# Patient Record
Sex: Female | Born: 1975 | Hispanic: No | Marital: Married | Smoking: Never smoker
Health system: Southern US, Community
[De-identification: ages and names within clinical notes are randomized; demographics above are authoritative.]

---

## 1999-07-03 ENCOUNTER — Ambulatory Visit (HOSPITAL_COMMUNITY): Admission: RE | Admit: 1999-07-03 | Discharge: 1999-07-03 | Payer: Self-pay | Admitting: *Deleted

## 1999-12-06 ENCOUNTER — Inpatient Hospital Stay (HOSPITAL_COMMUNITY): Admission: AD | Admit: 1999-12-06 | Discharge: 1999-12-06 | Payer: Self-pay | Admitting: *Deleted

## 1999-12-07 ENCOUNTER — Inpatient Hospital Stay (HOSPITAL_COMMUNITY): Admission: RE | Admit: 1999-12-07 | Discharge: 1999-12-11 | Payer: Self-pay | Admitting: *Deleted

## 1999-12-07 ENCOUNTER — Encounter (INDEPENDENT_AMBULATORY_CARE_PROVIDER_SITE_OTHER): Payer: Self-pay

## 2002-04-01 ENCOUNTER — Ambulatory Visit (HOSPITAL_COMMUNITY): Admission: RE | Admit: 2002-04-01 | Discharge: 2002-04-01 | Payer: Self-pay | Admitting: *Deleted

## 2002-04-22 ENCOUNTER — Emergency Department (HOSPITAL_COMMUNITY): Admission: AC | Admit: 2002-04-22 | Discharge: 2002-04-22 | Payer: Self-pay

## 2002-08-27 ENCOUNTER — Ambulatory Visit (HOSPITAL_COMMUNITY): Admission: RE | Admit: 2002-08-27 | Discharge: 2002-08-27 | Payer: Self-pay | Admitting: *Deleted

## 2002-09-09 ENCOUNTER — Inpatient Hospital Stay (HOSPITAL_COMMUNITY): Admission: AD | Admit: 2002-09-09 | Discharge: 2002-09-13 | Payer: Self-pay | Admitting: *Deleted

## 2006-07-02 ENCOUNTER — Ambulatory Visit (HOSPITAL_COMMUNITY): Admission: RE | Admit: 2006-07-02 | Discharge: 2006-07-02 | Payer: Self-pay | Admitting: Obstetrics and Gynecology

## 2006-10-30 ENCOUNTER — Ambulatory Visit: Payer: Self-pay | Admitting: *Deleted

## 2006-10-30 ENCOUNTER — Inpatient Hospital Stay (HOSPITAL_COMMUNITY): Admission: RE | Admit: 2006-10-30 | Discharge: 2006-10-30 | Payer: Self-pay | Admitting: Obstetrics and Gynecology

## 2006-11-03 ENCOUNTER — Ambulatory Visit: Payer: Self-pay | Admitting: Gynecology

## 2006-11-06 ENCOUNTER — Ambulatory Visit: Payer: Self-pay | Admitting: Obstetrics & Gynecology

## 2006-11-10 ENCOUNTER — Ambulatory Visit: Payer: Self-pay | Admitting: Family Medicine

## 2006-11-13 ENCOUNTER — Inpatient Hospital Stay (HOSPITAL_COMMUNITY): Admission: RE | Admit: 2006-11-13 | Discharge: 2006-11-16 | Payer: Self-pay | Admitting: Gynecology

## 2006-11-13 ENCOUNTER — Ambulatory Visit: Payer: Self-pay | Admitting: Gynecology

## 2006-11-13 ENCOUNTER — Ambulatory Visit: Payer: Self-pay | Admitting: Obstetrics & Gynecology

## 2006-11-13 ENCOUNTER — Encounter (INDEPENDENT_AMBULATORY_CARE_PROVIDER_SITE_OTHER): Payer: Self-pay | Admitting: Gynecology

## 2008-04-05 IMAGING — US US OB FOLLOW-UP
2 series · 14 of 28 positions shown · non-contrast
Comparison: none

OBSTETRICAL ULTRASOUND:

 This ultrasound exam was performed in the [HOSPITAL] Ultrasound Department.  The OB US report was generated in the AS system, and faxed to the ordering physician.  This report is also available in [REDACTED] PACS.

[Series 1: us ob re-eval · 13 of 49 slices shown (1 of 2)]
[im 2/49]
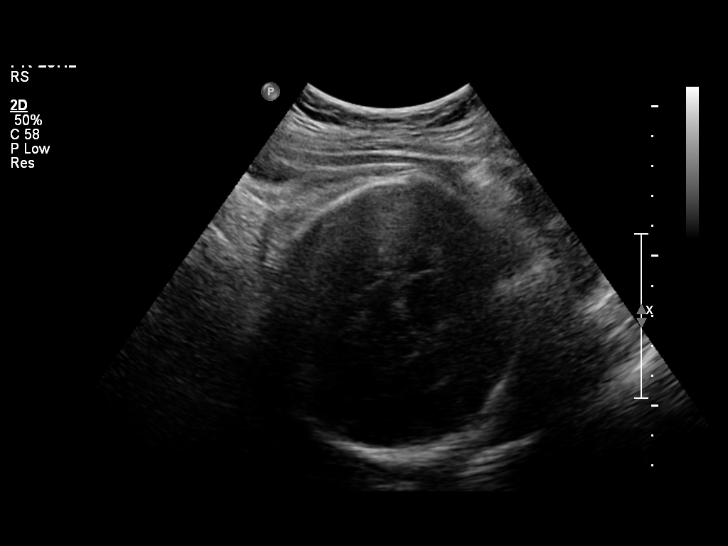
[im 6/49]
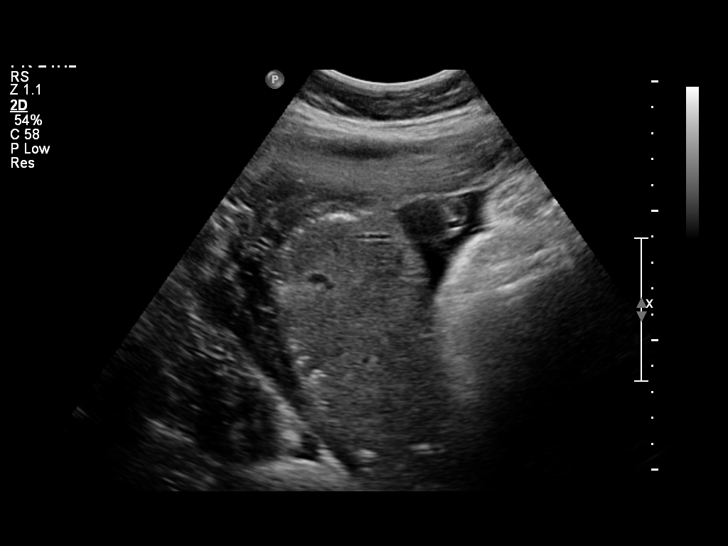
[im 10/49]
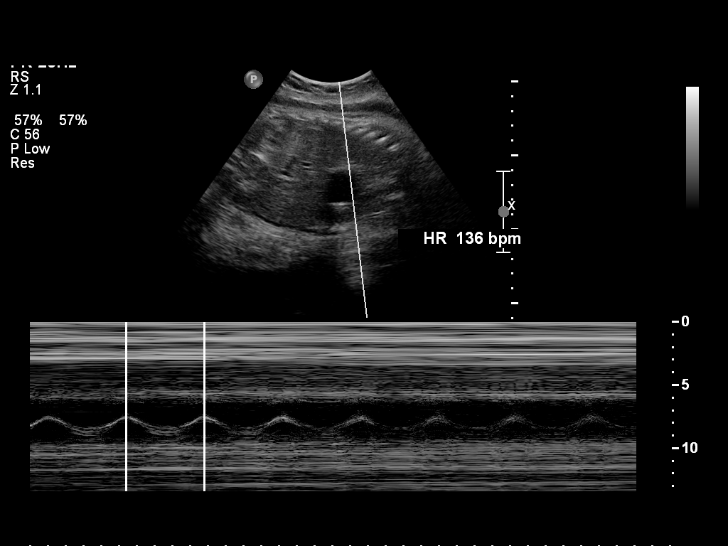
[im 14/49]
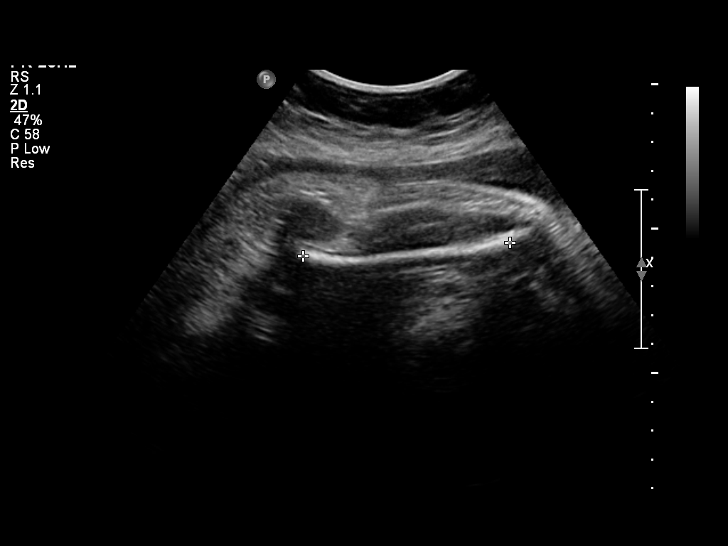
[im 18/49]
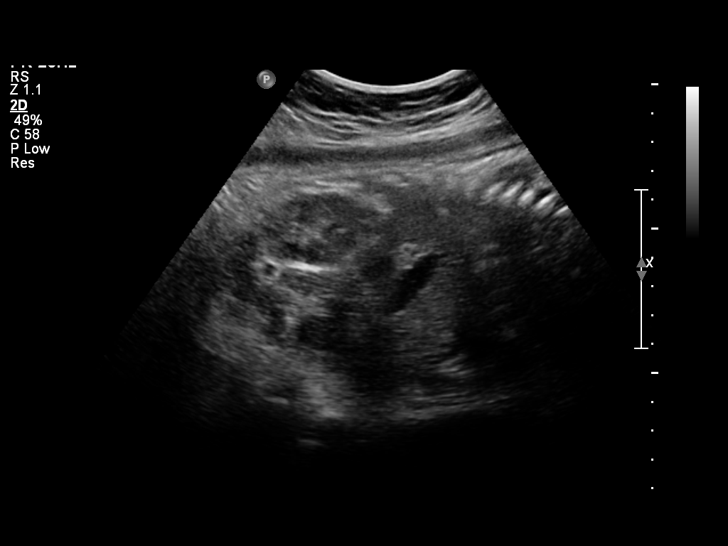
[im 22/49]
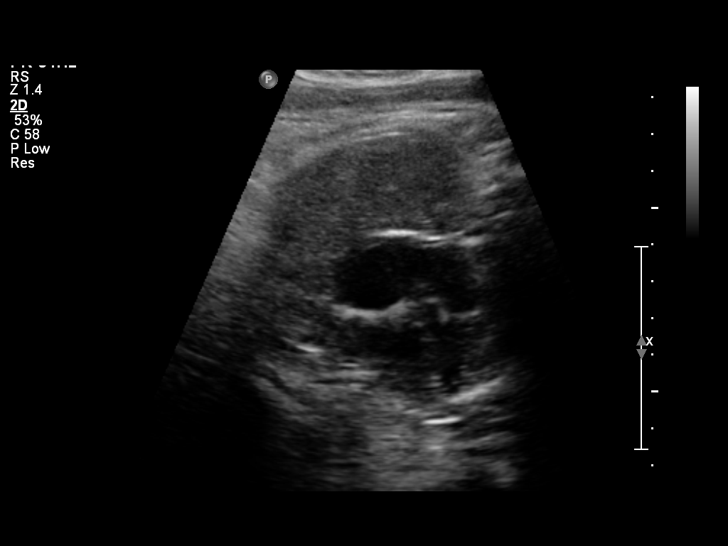
[im 25/49]
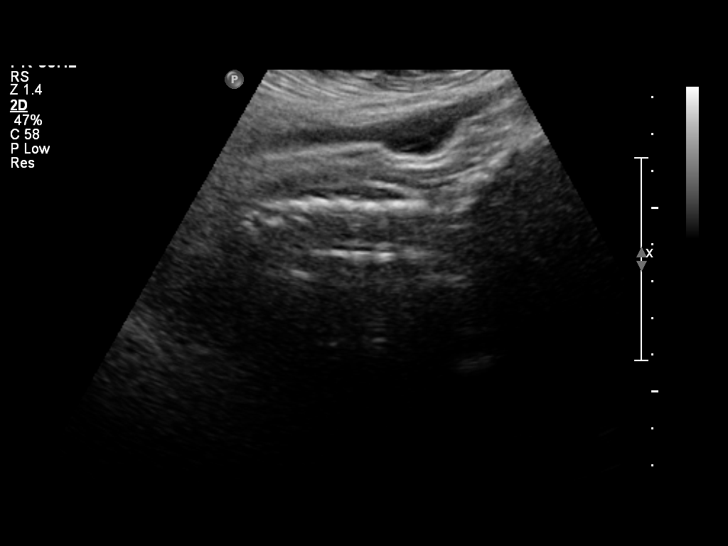
[im 29/49]
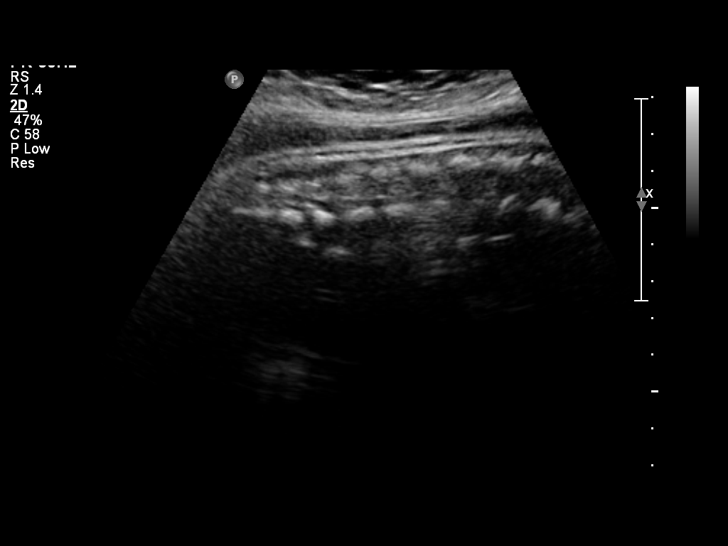
[im 33/49]
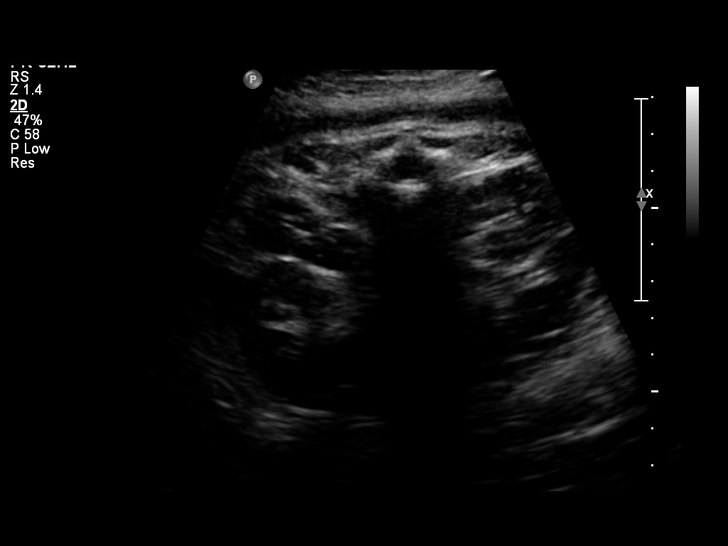
[im 37/49]
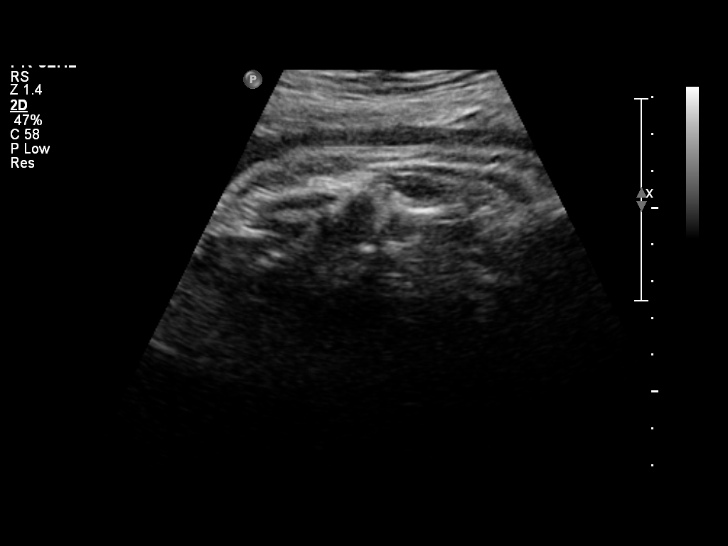
[im 41/49]
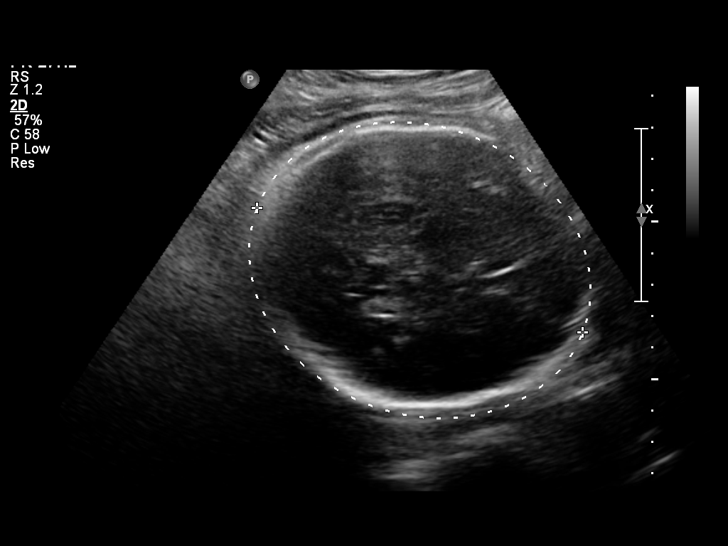
[im 45/49]
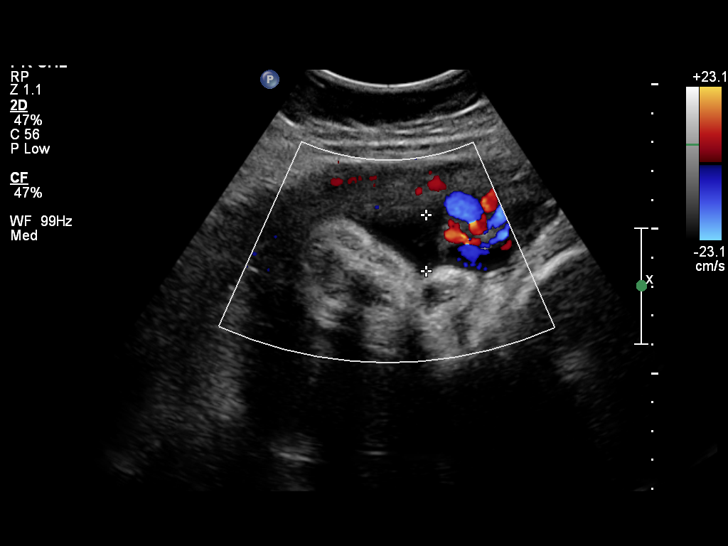
[im 49/49]
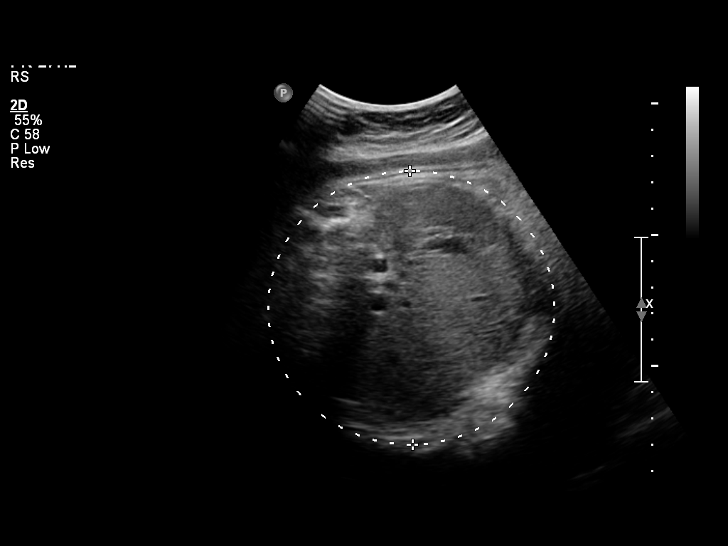

[Series 1: us ob re-eval · 1 of 4 slices shown (2 of 2)]
[im 4/4]
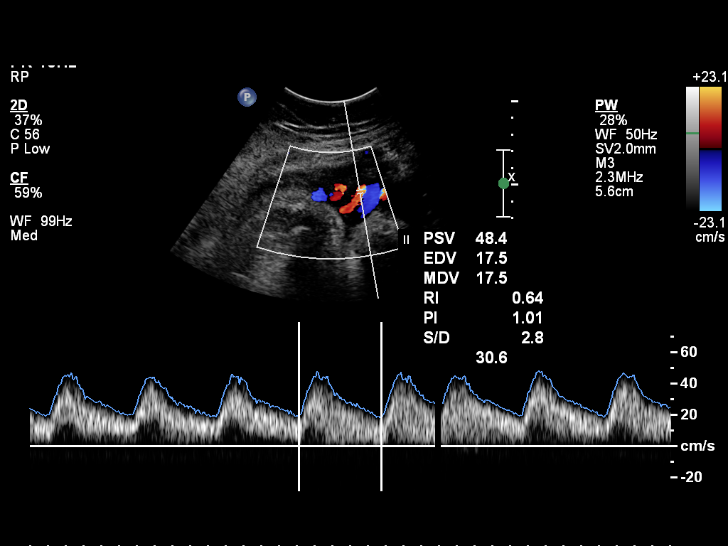

[14 of 28 positions shown; findings below may reference images not displayed]

IMPRESSION: See AS Obstetric US report.

## 2010-10-09 NOTE — Discharge Summary (Signed)
NAMEROSELL, KHOURI               ACCOUNT NO.:  [id number]   MEDICAL RECORD NO.:  [id number]          PATIENT TYPE:  INP   LOCATION:  9125                          FACILITY:  [facility]   PHYSICIAN:  Phil D. Okey Dupre, M.D.     DATE OF BIRTH:  1975-11-28   DATE OF ADMISSION:  11/13/2006  DATE OF DISCHARGE:  11/16/2006                               DISCHARGE SUMMARY   DISCHARGE DIAGNOSIS:  1. Term intrauterine pregnancy.  2. Repeat low transverse cesarean section/bilateral tubal ligation.  3. A2 gestational diabetes.  4. Acute blood loss anemia.   REASON FOR ADMISSION:  This is a 35 year old, G3, who had had previous  cesarean sections x2 in 2001 and 2004.  She was presenting as scheduled  for a  repeat elective C-section.  Of note, she had A2 gestational  diabetes, fair control and she had oligohydramnios.  There is good fetal  movement.  There is no rupture of membranes or bleeding.  She underwent  repeat LTCS/BTL and had a viable female infant. Postoperative course and  her surgery was uncomplicated.  The placenta was sent to pathology and  SEBL was 600 mL.  Weight was 8 pounds 15 ounces.  She had good pain  control postoperatively.  She was breast and bottle feeding without  problems and no orthostatic symptoms and tolerating regular diet.  Her  vital signs remained stable, stable hemoglobin was 10.4 on postoperative  day #3.  Had been 13.6.  Her abdomen was obese and minimally tender.  Fundus was firm and the incision was clean, dry, intact with staples  which were removed on postoperative day #3.  Dressing was applied.  There was good approximation.  She was discharged home to follow up with  myself in six weeks.   DISCHARGE MEDICATIONS:  1. Vitamin one a day.  2. Iron one twice a day.  3. Percocet 5/325 one to two q.4-6h, #30 with no refill.  4. Ibuprofen 600 one  p.o. q.6h.      Deirdre Christy Gentles, C.N.M.      Phil D. Okey Dupre, M.D.  Electronically Signed    DP/MEDQ  D:  12/31/2006   T:  01/01/2007  Job:  [id number]

## 2010-10-09 NOTE — Op Note (Signed)
Christy Peters, Christy Peters               ACCOUNT NO.:  [id number]   MEDICAL RECORD NO.:  [id number]          PATIENT TYPE:  INP   LOCATION:  9125                          [facility]ACILITY:  [facility]   PHYSICIAN:  Ginger Carne, MD  DATE OF BIRTH:  03/23/1976   DATE OF PROCEDURE:  11/13/2006  DATE OF DISCHARGE:                               OPERATIVE REPORT   PREOPERATIVE DIAGNOSIS:  Intrauterine pregnancy at term, history of  prior cesarean sections x2, gestational diabetes, oligohydramnios,  undesired fertility.   POSTOPERATIVE DIAGNOSIS:  Intrauterine pregnancy at term, history of  prior cesarean sections x2, gestational diabetes, oligohydramnios,  undesired fertility.   PROCEDURE:  Repeat low transverse cesarean section and bilateral tubal  ligation via Pomeroy method.   SURGEON:  Ginger Carne, M.D.   ASSISTANT:  Dr. Wilburt Finlay   ANESTHESIA:  Spinal.   SPECIMEN:  Placenta to labor and delivery.   ESTIMATED BLOOD LOSS:  600 mL.   COMPLICATIONS:  None.   FINDINGS:  Viable female infant, Apgars of eight and nine.  Birth weight 8  pounds 15 ounces.   PROCEDURE:  The patient was taken to the operating room where her spinal  anesthesia was found to be adequate.  She was then prepped and draped in  a normal sterile fashion in dorsal supine position with a leftward tilt.  The Pfannenstiel skin incision was then made with scalpel and carried  through to the underlying layer of fascia.  The fascia was incised in  the midline and the incision extended laterally with Mayo scissors.  The  superior aspect of the fascial incision was then grasped with Kocher  clamps, elevated and underlying rectus muscles dissected off bluntly.  Attention was then turned to the inferior aspect of the incision which  in a similar fashion was grasped, tented up with the Kocher clamps and  the rectus muscles dissected off bluntly.  The rectus muscles were then  separated in the midline and the peritoneum  identified, tented up and  entered sharply with the Metzenbaum scissors.  The peritoneal incision  was then extended superiorly and inferiorly with good visualization of  the bladder.  The bladder blade was inserted and the vesicouterine  peritoneum identified, grasped with pickups and entered sharply with  Metzenbaum scissors.  This incision was then extended laterally and the  bladder flap created digitally.  The bladder blade was then reinserted  and the lower uterine segment was noted to be very translucent and was  incised in a transverse fashion with a scalpel.  The uterine incision  was extended bluntly.  The bladder blade was removed and the infant's  head delivered atraumatically.  The nose and mouth were suctioned and  the cord clamped and cut.  The infant was handed off to the waiting  pediatricians.  Cord blood was obtained.  The placenta was then removed  spontaneously.  The uterus exteriorized and cleared of all clots and  debris.  The uterine incision was repaired with 0 Vicryl in a running  locked fashion.  Excellent hemostasis was noted and attention was turned  to  the tubal ligation. The patient's left fallopian tube was identified,  grasped with a Babcock clamp and the tube followed out to its individual  fimbria.  The Babcock clamp was then used to grasp the tube  approximately 4 cm from the cornual region. The tube was anchored with a  suture and tied with plain gut x2 and excised. The ends of the tubes  were electrocauterized with excellent hemostasis noted.  The tube was  returned to the abdomen and the patient's right fallopian tube was  identified, brought to the incision and grasped with Babcock clamp.  This tube was also followed out to its fimbria.  The Babcock clamp was  used to grasp the tube approximately 4 cm from the cornual region.  The  tube was anchored and a 3 cm segment of tube was tied with plain gut x2  and excised the ends of the tube were  electrocauterized with excellent  hemostasis noted.  The tube was returned to the abdomen.  The uterus was  returned to the to the abdomen.  The uterine incision was reinspected  again several times with excellent hemostasis noted.  The gutters were  cleared of all clots and the fascia was reapproximated with 0 Vicryl in  a running fashion. The skin was closed with staples. The patient  tolerated the procedure well.  Sponge, lap and needle counts were  correct x2.  2 grams of Ancef was given at cord clamp.  The patient was  taken to recovery room in stable condition.     ______________________________  Paticia Stack, MD      Ginger Carne, MD  Electronically Signed    LNJ/MEDQ  D:  11/13/2006  T:  11/13/2006  Job:  [id number]

## 2010-10-12 NOTE — Discharge Summary (Signed)
[facility]  Patient:    Christy Peters, Christy Peters                       MRN: [id number] Adm. Date:  12/07/99 Disc. Date: 12/11/99 Attending:  Conni Elliot, M.D. Dictator:   Jacobo Forest, M.D.                           Discharge Summary  DISCHARGE DIAGNOSES:          1. Status post lower transverse cesarean section                                  for repetitive fetal heart rate                                  decelerations.                               2. Anemia.  DISCHARGE MEDICATIONS:        Ibuprofen, Micronor, iron.  FOLLOW-UP:                    The patient is to follow up at [facility] in six weeks.  HISTORY AND PHYSICAL:         In brief, [name] is a 35 year old, G1, P0, Hispanic female, Spanish speaker only, at 40-5/7 weeks, who was sent to [facility] after a routine postpartum testing showing BPP 6/8 and AFI 1.2.  Prenatal history significant for GBS negative.  The patient was admitted post dates with severe oligohydramnios.  The fetal heart rate tracing was within normal limits.  HOSPITAL COURSE:              [name] was admitted post dates with severe oligohydramnios.  Soon after admission, she was seen to have repetitive fetal heart rate decelerations and taken for lower transverse cesarean section, which was performed by Conni Elliot, M.D., and Jacobo Forest, M.D.  No complications.  The patient had a viable female with Apgars of 8 at one minute and 9 at five minutes.  Her postpartum course was unremarkable.  DISPOSITION:                  The patient was discharged home in good condition on December 11, 1999, with staples removed. DD:  02/29/00 TD:  03/01/00 Job: [id number]

## 2010-10-12 NOTE — Discharge Summary (Signed)
   [name], Christy Peters                           ACCOUNT NO.:  [id number]   MEDICAL RECORD NO.:  [id number]                   PATIENT TYPE:  INP   LOCATION:  9136                                 FACILITY:  [facility]   PHYSICIAN:  Tanya S. Shawnie Pons, M.D.                DATE OF BIRTH:  10-29-1975   DATE OF ADMISSION:  09/09/2002  DATE OF DISCHARGE:  09/13/2002                                 DISCHARGE SUMMARY   FINAL DIAGNOSES:  1. Intrauterine pregnancy at 52 and 6/ths weeks.  2. Severe oligohydramnios.  3. Previous cesarean section.  4. Gestational diabetes mellitus, A1.   REASON FOR ADMISSION:  Briefly, the patient is a 35 year old, G2, P55, at 8  and 6/7ths week with severe oligohydramnios, and a previous cesarean  section, who had gestational diabetes during this pregnancy, and elected for  a repeat cesarean section.   HOSPITAL COURSE:  The patient was admitted on the night prior to her  cesarean section for continuous fetal monitoring.  She then underwent an  elective repeat cesarean section the next day by Dr. Gavin Potters.  It was a low  transverse, and there were no complications.  The patient postoperatively  did well and was discharged home on postoperative day #3.   DISCHARGE INSTRUCTIONS:  1. Pelvic rest for the next six weeks.  2. No heavy lifting for the next six weeks.  3. Follow up in two weeks for an incision check.  4. Postpartum check in six weeks.  She will need blood sugar testing at her     postpartum visit.   She will breast and bottle feed, and she is on Micronor for contraception.                                               [name]. Shawnie Pons, M.D.    TSP/MEDQ  D:  01/12/2003  T:  01/12/2003  Job:  [id number]

## 2010-10-12 NOTE — Op Note (Signed)
[facility]  Patient:    Christy Peters, Christy Peters                        MRN: [id number] Proc. Date: 12/08/99 Adm. Date:  [id number] Attending:  Michaelle Copas                           Operative Report  PREOPERATIVE DIAGNOSIS:       Repetitive fetal heart rate decellerations with fetal tachycardia and oligohydramnios.  POSTOPERATIVE DIAGNOSIS:      Repetitive fetal heart rate decellerations with fetal tachycardia and oligohydramnios.  OPERATION:                    Low transverse cesarean section.  SURGEON:                      Conni Elliot, M.D.  ASSISTANT:                    Jacobo Forest, M.D.  ANESTHESIA:                   Continuous lumbar epidural.  ESTIMATED BLOOD LOSS:  FINDINGS:                     A 5 pound 15 ounce female with Apgars of 8 and 9.  Cord pH is 7.34.  Placenta was sent to pathology.  DESCRIPTION OF PROCEDURE:     After bringing the continuous lumbar epidural anesthetic to an operative level with the patient in supine left lateral tilt position receiving oxygen, the abdomen was prepped and draped in the usual sterile fashion.  A low transverse Pfannenstiel incision was made.  Incision was made through the skin, subcutaneous tissue, and fascia.  The rectus muscles were separated in the midline.  The peritoneal cavity was entered.  The bladder flap was created.  A low transverse uterine incision was made.  The babys head was delivered and DeLee suctioned.  The remainder of the baby was delivered.  Cord doubly clamped and cut and the baby handed to the neonatologist in attendance. The placenta was delivered spontaneously.  There was meconium noted along the placenta and rectum of the child.  The uterus, bladder flap, anterior peritoneum, fascia, subcutaneous tissue, and skin were closed in a routine fashion.  Estimated blood loss was less than 1000 cc.  Sponge, needle, and instrument counts were correct. DD:   12/08/99 TD:  12/08/99 Job: [id number]

## 2011-03-13 LAB — CBC
HCT: 40.8
Hemoglobin: 10.8 — ABNORMAL LOW
MCHC: 33.3
MCV: 83.4
MCV: 84.8
Platelets: 146 — ABNORMAL LOW
RBC: 3.82 — ABNORMAL LOW
RDW: 16.4 — ABNORMAL HIGH
RDW: 16.6 — ABNORMAL HIGH
WBC: 6.9

## 2011-03-13 LAB — POCT URINALYSIS DIP (DEVICE)
Ketones, ur: NEGATIVE
Operator id: 120861
Protein, ur: NEGATIVE
Urobilinogen, UA: 0.2
pH: 7

## 2011-03-13 LAB — RPR: RPR Ser Ql: NONREACTIVE

## 2011-03-13 LAB — TYPE AND SCREEN: Antibody Screen: NEGATIVE

## 2011-03-14 LAB — POCT URINALYSIS DIP (DEVICE)
Bilirubin Urine: NEGATIVE
Glucose, UA: NEGATIVE
Hgb urine dipstick: NEGATIVE
Ketones, ur: NEGATIVE
Operator id: 134861
Specific Gravity, Urine: 1.015

## 2017-05-26 ENCOUNTER — Ambulatory Visit: Payer: Self-pay | Admitting: Family Medicine

## 2017-05-26 ENCOUNTER — Other Ambulatory Visit: Payer: Self-pay

## 2017-05-26 ENCOUNTER — Encounter: Payer: Self-pay | Admitting: Family Medicine

## 2017-05-26 VITALS — BP 122/82 | HR 130 | Temp 99.7°F | Resp 16 | Ht 59.45 in | Wt 169.0 lb

## 2017-05-26 DIAGNOSIS — J101 Influenza due to other identified influenza virus with other respiratory manifestations: Secondary | ICD-10-CM

## 2017-05-26 DIAGNOSIS — R509 Fever, unspecified: Secondary | ICD-10-CM

## 2017-05-26 DIAGNOSIS — R300 Dysuria: Secondary | ICD-10-CM

## 2017-05-26 LAB — POCT URINALYSIS DIP (MANUAL ENTRY)
BILIRUBIN UA: NEGATIVE
Glucose, UA: NEGATIVE mg/dL
Nitrite, UA: NEGATIVE
Protein Ur, POC: 100 mg/dL — AB
Spec Grav, UA: 1.02 (ref 1.010–1.025)
Urobilinogen, UA: 4 E.U./dL — AB
pH, UA: 6 (ref 5.0–8.0)

## 2017-05-26 LAB — POCT INFLUENZA A/B
Influenza A, POC: POSITIVE — AB
Influenza B, POC: NEGATIVE

## 2017-05-26 MED ORDER — CEPHALEXIN 500 MG PO CAPS: 500.0000 mg | ORAL_CAPSULE | Freq: Three times a day (TID) | ORAL | 0 refills | Status: DC

## 2017-05-26 MED ORDER — OSELTAMIVIR PHOSPHATE 75 MG PO CAPS: 75.0000 mg | ORAL_CAPSULE | Freq: Two times a day (BID) | ORAL | 0 refills | Status: DC

## 2017-05-26 NOTE — Progress Notes (Signed)
Subjective:    Patient ID: Christy Peters, female    DOB: 20-Jan-1976, 41 y.o.   MRN: [id number]  05/26/2017  Fever (with bodyachs and chills x 3 days )    HPI This 41 y.o. female presents for 3-4 days of feve, chills/sweats.  Mild headache.  Has not checked temperature. No ear pain.  No sore throat.  No rhinorrhea.  No body aches.  No cough.  No n/v/d.  No abdominal pain.    dysuria for one day with onset of fever.  Yellow urine.  No hematuria.  No urinary frequency. Nocturia x 0. No sick contacts. No working.  Tylenol 2:00pm. No SOB.     BP Readings from Last 3 Encounters:  05/26/17 122/82   Wt Readings from Last 3 Encounters:  05/26/17 169 lb (76.7 kg)    There is no immunization history on file for this patient.  Review of Systems  Constitutional: Positive for fatigue and fever. Negative for chills and diaphoresis.  HENT: Negative for congestion, ear pain, postnasal drip, rhinorrhea and sore throat.   Eyes: Negative for visual disturbance.  Respiratory: Negative for cough and shortness of breath.   Cardiovascular: Negative for chest pain, palpitations and leg swelling.  Gastrointestinal: Negative for abdominal pain, anal bleeding, blood in stool, constipation, diarrhea, nausea and vomiting.  Endocrine: Negative for cold intolerance, heat intolerance, polydipsia, polyphagia and polyuria.  Genitourinary: Positive for dysuria.  Musculoskeletal: Negative for arthralgias.  Skin: Negative for rash.  Neurological: Negative for dizziness, tremors, seizures, syncope, facial asymmetry, speech difficulty, weakness, light-headedness, numbness and headaches.    History reviewed. No pertinent past medical history. History reviewed. No pertinent surgical history. No Known Allergies No current outpatient medications on file prior to visit.   No current facility-administered medications on file prior to visit.    Social History   Socioeconomic History  . Marital status: Married   Spouse name: Not on file  . Number of children: Not on file  . Years of education: Not on file  . Highest education level: Not on file  Social Needs  . Financial resource strain: Not on file  . Food insecurity - worry: Not on file  . Food insecurity - inability: Not on file  . Transportation needs - medical: Not on file  . Transportation needs - non-medical: Not on file  Occupational History  . Not on file  Tobacco Use  . Smoking status: Never Smoker  . Smokeless tobacco: Never Used  Substance and Sexual Activity  . Alcohol use: Not on file  . Drug use: Not on file  . Sexual activity: Not on file  Other Topics Concern  . Not on file  Social History Narrative  . Not on file   History reviewed. No pertinent family history.     Objective:    BP 122/82   Pulse (!) 130   Temp 99.7 F (37.6 C) (Oral)   Resp 16   Ht 4' 11.45" (1.51 m)   Wt 169 lb (76.7 kg)   LMP  (LMP Unknown)   SpO2 95%   BMI 33.62 kg/m  Physical Exam  Constitutional: She is oriented to person, place, and time. She appears well-developed and well-nourished. No distress.  HENT:  Head: Normocephalic and atraumatic.  Right Ear: External ear normal.  Left Ear: External ear normal.  Nose: Nose normal.  Mouth/Throat: Oropharynx is clear and moist.  Eyes: Conjunctivae and EOM are normal. Pupils are equal, round, and reactive to light.  Neck: Normal  range of motion. Neck supple. Carotid bruit is not present. No thyromegaly present.  Cardiovascular: Normal rate, regular rhythm, normal heart sounds and intact distal pulses. Exam reveals no gallop and no friction rub.  No murmur heard. Pulmonary/Chest: Effort normal and breath sounds normal. She has no wheezes. She has no rales.  Abdominal: Soft. Bowel sounds are normal. She exhibits no distension and no mass. There is no tenderness. There is no rebound and no guarding.  Lymphadenopathy:    She has no cervical adenopathy.  Neurological: She is alert and  oriented to person, place, and time. No cranial nerve deficit.  Skin: Skin is warm and dry. No rash noted. She is not diaphoretic. No erythema. No pallor.  Psychiatric: She has a normal mood and affect. Her behavior is normal.   No results found. Depression screen PHQ 2/9 05/26/2017  Decreased Interest 0  Down, Depressed, Hopeless 0  PHQ - 2 Score 0   Fall Risk  05/26/2017  Falls in the past year? No     Results for orders placed or performed in visit on 05/26/17  POCT Influenza A/B  Result Value Ref Range   Influenza A, POC Positive (A) Negative   Influenza B, POC Negative Negative  POCT urinalysis dipstick  Result Value Ref Range   Color, UA yellow yellow   Clarity, UA cloudy (A) clear   Glucose, UA negative negative mg/dL   Bilirubin, UA negative negative   Ketones, POC UA trace (5) (A) negative mg/dL   Spec Grav, UA 5.7841.020 6.9621.010 - 1.025   Blood, UA moderate (A) negative   pH, UA 6.0 5.0 - 8.0   Protein Ur, POC =100 (A) negative mg/dL   Urobilinogen, UA 4.0 (A) 0.2 or 1.0 E.U./dL   Nitrite, UA Negative Negative   Leukocytes, UA Small (1+) (A) Negative       Assessment & Plan:   1. Fever, unspecified fever cause   2. Influenza A   3. Dysuria     New onset influenza type A.  Prescription for Tamiflu provided.  Recommend supportive care with Tylenol alternating with ibuprofen every 4 hours.  Also recommend Mucinex DM 1 tablet twice daily if cough develops.  Minimal symptoms at this time which is quite atypical.  Patient may develop sore throat, rhinorrhea, cough.  Return to clinic for acute decline.  New onset dysuria with associated fever in a patient did test positive for influenza A.  Due to abnormal urine and recent dysuria, empirically treat with Keflex therapy.  Return to clinic for flank pain, vomiting.  Urine culture not sent as patient is self-pay.  Patient should return to clinic if symptoms do not improve with Keflex.   Orders Placed This Encounter    Procedures  . POCT Influenza A/B  . POCT urinalysis dipstick   Meds ordered this encounter  Medications  . oseltamivir (TAMIFLU) 75 MG capsule    Sig: Take 1 capsule (75 mg total) by mouth 2 (two) times daily.    Dispense:  10 capsule    Refill:  0  . cephALEXin (KEFLEX) 500 MG capsule    Sig: Take 1 capsule (500 mg total) by mouth 3 (three) times daily.    Dispense:  21 capsule    Refill:  0    No Follow-up on file.   Danitza Schoenfeldt Paulita FujitaMartin Kirby Cortese, M.D. [facility]  [place] [facility] [address] [place], [place]  [id number] [phone] phone [phone] fax

## 2017-05-26 NOTE — Patient Instructions (Addendum)
Take TYLENOL TWO TABLETS. FOUR HOURS LATER, TAKE IBUPROFEN TWO TABLETS. THEN FOUR HOURS LATER, TYLENOL TWO TABLETS; THEN FOUR HOURS LATER, IBUPROFEN TWO TABLETS.   IF you received an x-ray today, you will receive an invoice from [facility]. Please contact [facility] at [phone] with questions or concerns regarding your invoice.   IF you received labwork today, you will receive an invoice from [facility]. Please contact [facility] at [phone] with questions or concerns regarding your invoice.   Our billing staff will not be able to assist you with questions regarding bills from these companies.  You will be contacted with the lab results as soon as they are available. The fastest way to get your results is to activate your My Chart account. Instructions are located on the last page of this paperwork. If you have not heard from us regarding the results in 2 weeks, please contact this office.     \ Gripe en los adultos (Influenza, Adult) La gripe es una infeccin en los pulmones, la nariz y la garganta (vas respiratorias). La causa un virus. La gripe provoca muchos sntomas del resfro comn, as como fiebre alta y Tourist information centre managerdolor corporal. Puede hacer que se sienta muy mal. Se transmite fcilmente de persona a persona (es contagiosa). La mejor manera de prevenir la gripe es aplicndose la vacuna contra la gripe todos los aos. CUIDADOS EN EL HOGAR  Tome los medicamentos de venta libre y los recetados solamente como se lo haya indicado el mdico.  Use un humidificador de aire fro para que el aire de su casa est ms hmedo. Esto puede facilitar la respiracin.  Descanse todo lo que sea necesario.  Beba suficiente lquido para mantener el pis (orina) claro o de color amarillo plido.  Al toser o estornudar, cbrase la boca y la [place].  Lvese las manos con agua y jabn frecuentemente, en especial despus de toser o Engineering geologistestornudar. Use un desinfectante para manos si no  dispone de [place] y [place]jabn.  Lanny HurstQudese en su casa y no concurra al Aleen Campitrabajo o a la escuela, como se lo haya indicado el mdico. A menos que deba ir al American Expressmdico, evite salir de su casa hasta que no tenga fiebre durante 24horas sin el uso de medicamentos.  Concurra a todas las visitas de control como se lo haya indicado el mdico. Esto es importante.  PREVENCIN  Aplicarse la vacuna anual contra la gripe es la mejor manera de evitar contagiarse la gripe. Puede aplicarse la vacuna contra la gripe a fines de verano, en otoo o en invierno. Pregntele al mdico cundo debe aplicarse la vacuna contra la gripe.  Lvese las manos o use un desinfectante de manos con frecuencia.  Evite el contacto con personas que estn enfermas durante la temporada de resfro y gripe.  Consuma alimentos saludables.  Beba abundantes lquidos.  Duerma lo suficiente.  Haga ejercicios regularmente.  SOLICITE AYUDA SI:  Tiene sntomas nuevos.  Tiene los siguientes sntomas: ? Journalist, newspaperDolor en el pecho. ? Deposiciones lquidas (diarrea). ? Fiebre.  La tos empeora.  Empieza a tener ms mucosidad.  Siente malestar estomacal (nuseas).  Vomita.  SOLICITE AYUDA DE INMEDIATO SI:  Siente que le falta el aire o tiene dificultad para Industrial/product designerrespirar.  La piel o las uas se tornan de un color Brooklynazulado.  Presenta un dolor intenso o rigidez en el cuello.  Siente dolor de cabeza de forma repentina.  Le duele la cara o el odo de forma repentina.  No puede detener los vmitos.  Esta informacin no tiene Theme park managercomo fin reemplazar el consejo del mdico. Asegrese de hacerle al mdico cualquier pregunta que tenga. Document Released: 08/09/2008 Document Revised: 09/04/2015 Document Reviewed: 03/07/2015 Elsevier Interactive Patient Education  2017 [employer]

## 2017-10-16 ENCOUNTER — Encounter: Payer: Self-pay | Admitting: Family Medicine

## 2022-07-24 ENCOUNTER — Encounter (HOSPITAL_COMMUNITY): Payer: Self-pay | Admitting: *Deleted

## 2022-07-24 ENCOUNTER — Other Ambulatory Visit: Payer: Self-pay

## 2022-07-24 ENCOUNTER — Ambulatory Visit (HOSPITAL_COMMUNITY)
Admission: EM | Admit: 2022-07-24 | Discharge: 2022-07-24 | Disposition: A | Discharge status: Home / Self Care | Payer: Self-pay | Attending: Physician Assistant | Admitting: Physician Assistant

## 2022-07-24 DIAGNOSIS — R1013 Epigastric pain: Secondary | ICD-10-CM | POA: Insufficient documentation

## 2022-07-24 LAB — CBC WITH DIFFERENTIAL/PLATELET
Abs Immature Granulocytes: 0.01 10*3/uL (ref 0.00–0.07)
Basophils Absolute: 0.1 10*3/uL (ref 0.0–0.1)
Basophils Relative: 1 %
Eosinophils Absolute: 0.1 10*3/uL (ref 0.0–0.5)
Eosinophils Relative: 1 %
HCT: 39.8 % (ref 36.0–46.0)
Hemoglobin: 12.9 g/dL (ref 12.0–15.0)
Immature Granulocytes: 0 %
Lymphocytes Relative: 45 %
Lymphs Abs: 2.9 10*3/uL (ref 0.7–4.0)
MCH: 27.9 pg (ref 26.0–34.0)
MCHC: 32.4 g/dL (ref 30.0–36.0)
MCV: 86.1 fL (ref 80.0–100.0)
Monocytes Absolute: 0.5 10*3/uL (ref 0.1–1.0)
Monocytes Relative: 7 %
Neutro Abs: 3 10*3/uL (ref 1.7–7.7)
Neutrophils Relative %: 46 %
Platelets: 275 10*3/uL (ref 150–400)
RBC: 4.62 MIL/uL (ref 3.87–5.11)
RDW: 13.7 % (ref 11.5–15.5)
WBC: 6.4 10*3/uL (ref 4.0–10.5)
nRBC: 0 % (ref 0.0–0.2)

## 2022-07-24 LAB — COMPREHENSIVE METABOLIC PANEL
ALT: 17 U/L (ref 0–44)
AST: 18 U/L (ref 15–41)
Albumin: 3.7 g/dL (ref 3.5–5.0)
Alkaline Phosphatase: 60 U/L (ref 38–126)
Anion gap: 12 (ref 5–15)
BUN: 11 mg/dL (ref 6–20)
CO2: 23 mmol/L (ref 22–32)
Calcium: 9.5 mg/dL (ref 8.9–10.3)
Chloride: 107 mmol/L (ref 98–111)
Creatinine, Ser: 1.02 mg/dL — ABNORMAL HIGH (ref 0.44–1.00)
GFR, Estimated: >60 mL/min (ref >60)
Glucose, Bld: 93 mg/dL (ref 70–99)
Potassium: 3.6 mmol/L (ref 3.5–5.1)
Sodium: 142 mmol/L (ref 135–145)
Total Bilirubin: 0.5 mg/dL (ref 0.3–1.2)
Total Protein: 7.6 g/dL (ref 6.5–8.1)

## 2022-07-24 LAB — LIPASE, BLOOD: Lipase: 30 U/L (ref 11–51)

## 2022-07-24 MED ORDER — SUCRALFATE 1 G PO TABS: 1.0000 g | ORAL_TABLET | Freq: Three times a day (TID) | ORAL | 0 refills | Status: AC

## 2022-07-24 MED ORDER — PANTOPRAZOLE SODIUM 40 MG PO TBEC: 40.0000 mg | DELAYED_RELEASE_TABLET | Freq: Every day | ORAL | 0 refills | Status: DC

## 2022-07-24 NOTE — Discharge Instructions (Addendum)
Comience Carafate antes de cada comida y antes de la cena. Tome Protonix con Product manager vaco (30 minutos antes de las comidas o 2 horas despus). Consuma una dieta blanda y [employer] alimentos picantes, cidos o grasos. No tome AINE, incluidos aspirina, ibuprofeno/Advil, naproxeno/Aleve. Evite el alcohol. Quiero que hagas un seguimiento con un especialista gastrointestinal; llmelos para programar una cita. Si tiene dolor intenso, nuseas, vmitos, sangre en el vmito o [employer], debe acudir a la sala de Multimedia programmer.

## 2022-07-24 NOTE — ED Provider Notes (Signed)
[facility]    CSN: [id number] Arrival date & time: 07/24/22  1152      History   Chief Complaint Chief Complaint  Patient presents with   Abdominal Pain    HPI Christy Peters is a 47 y.o. female.   Patient presents today with a several month history of intermittent epigastric abdominal pain.  She is Spanish-speaking and video interpreter was utilized during visit.  She reports that during episodes pain is rated 8 on a 0-10 pain scale, described as a burning, localized to epigastrium with radiation towards her back, no aggravating or alleviating factors identified.  She has tried Tylenol and ibuprofen without improvement.  Denies history of gastrointestinal disorder including previous episodes of pancreatitis.  She is currently asymptomatic but reports that symptoms have become more frequent prompting evaluation.  She has not identified any trigger including dietary changes.  Denies any recent medication change, increased alcohol consumption, GLP-1 agonist use.  She denies additional symptoms including fever, nausea, vomiting, melena, hematochezia.  She has not seen a GI specialist in the past.    History reviewed. No pertinent past medical history.  There are no problems to display for this patient.   Past Surgical History:  Procedure Laterality Date   CESAREAN SECTION      OB History   No obstetric history on file.      Home Medications    Prior to Admission medications   Medication Sig Start Date End Date Taking? Authorizing Provider  pantoprazole (PROTONIX) 40 MG tablet Take 1 tablet (40 mg total) by mouth daily. 07/24/22  Yes Oumar Marcott K, PA-C  sucralfate (CARAFATE) 1 g tablet Take 1 tablet (1 g total) by mouth 4 (four) times daily -  with meals and at bedtime. 07/24/22  Yes Jaquarious Grey, Derry Skill, PA-C    Family History History reviewed. No pertinent family history.  Social History Social History   Tobacco Use   Smoking status: Never   Smokeless  tobacco: Never  Vaping Use   Vaping Use: Never used  Substance Use Topics   Alcohol use: Yes    Comment: occasionally     Allergies   Patient has no known allergies.   Review of Systems Review of Systems  Constitutional:  Positive for activity change. Negative for appetite change, fatigue and fever.  Respiratory:  Negative for cough and shortness of breath.   Cardiovascular:  Negative for chest pain.  Gastrointestinal:  Positive for abdominal pain. Negative for diarrhea, nausea and vomiting.  Neurological:  Negative for dizziness, light-headedness and headaches.     Physical Exam Triage Vital Signs ED Triage Vitals  Enc Vitals Group     BP 07/24/22 1331 (!) 153/87     Pulse Rate 07/24/22 1331 80     Resp 07/24/22 1331 16     Temp 07/24/22 1331 98.1 F (36.7 C)     Temp Source 07/24/22 1331 Oral     SpO2 07/24/22 1331 99 %     Weight --      Height --      Head Circumference --      Peak Flow --      Pain Score 07/24/22 1334 0     Pain Loc --      Pain Edu? --      Excl. in [place]? --    No data found.  Updated Vital Signs BP (!) 153/87   Pulse 80   Temp 98.1 F (36.7 C) (Oral)   Resp  16   LMP 07/17/2022 (Approximate)   SpO2 99%   Visual Acuity Right Eye Distance:   Left Eye Distance:   Bilateral Distance:    Right Eye Near:   Left Eye Near:    Bilateral Near:     Physical Exam Vitals reviewed.  Constitutional:      General: She is awake. She is not in acute distress.    Appearance: Normal appearance. She is well-developed. She is not ill-appearing.     Comments: Very pleasant female appears stated age in no acute distress sitting comfortably in exam room  HENT:     Head: Normocephalic and atraumatic.     Mouth/Throat:     Pharynx: Uvula midline. No oropharyngeal exudate or posterior oropharyngeal erythema.  Cardiovascular:     Rate and Rhythm: Normal rate and regular rhythm.     Heart sounds: Normal heart sounds, S1 normal and S2 normal. No  murmur heard. Pulmonary:     Effort: Pulmonary effort is normal.     Breath sounds: Normal breath sounds. No wheezing, rhonchi or rales.     Comments: Clear to auscultation bilaterally Abdominal:     General: Bowel sounds are normal.     Palpations: Abdomen is soft.     Tenderness: There is no abdominal tenderness. There is no right CVA tenderness, left CVA tenderness, guarding or rebound.     Comments: Benign abdominal exam  Psychiatric:        Behavior: Behavior is cooperative.      UC Treatments / Results  Labs (all labs ordered are listed, but only abnormal results are displayed) Labs Reviewed  CBC WITH DIFFERENTIAL/PLATELET  COMPREHENSIVE METABOLIC PANEL  LIPASE, BLOOD    EKG   Radiology No results found.  Procedures Procedures (including critical care time)  Medications Ordered in UC Medications - No data to display  Initial Impression / Assessment and Plan / UC Course  I have reviewed the triage vital signs and the nursing notes.  Pertinent labs & imaging results that were available during my care of the patient were reviewed by me and considered in my medical decision making (see chart for details).     Patient is well-appearing, afebrile, nontoxic, nontachycardic.  EKG was obtained which showed normal sinus rhythm with ventricular rate of 81 bpm no ischemic changes; no previous to compare.  She is currently asymptomatic and had a benign abdominal exam send no indication for emergent evaluation or imaging.  Differential includes pancreatitis versus GERD versus peptic ulcer disease.  CBC, CMP, lipase obtained today and are pending.  We will contact her if any of this lab work is abnormal.  Recommended she start Carafate as well as Protonix to manage her symptoms.  She is to avoid spicy/acidic/fatty foods, alcohol consumption, NSAIDs.  Recommended that she follow-up with GI for further evaluation and management and was given contact information for local provider  with instruction to call to schedule an appointment.  We discussed that we do not have imaging capabilities in urgent care and if she has recurrent severe symptoms she should be reevaluated immediately.  Strict return precautions given.  Work excuse note provided.  Final Clinical Impressions(s) / UC Diagnoses   Final diagnoses:  Abdominal pain, epigastric     Discharge Instructions      Comience Carafate antes de cada comida y antes de la cena. Tome Protonix con Product manager vaco (30 minutos antes de las comidas o 2 horas despus). Consuma una dieta blanda y [employer]  alimentos picantes, cidos o grasos. No tome AINE, incluidos aspirina, ibuprofeno/Advil, naproxeno/Aleve. Evite el alcohol. Quiero que hagas un seguimiento con un especialista gastrointestinal; llmelos para programar una cita. Si tiene dolor intenso, nuseas, vmitos, sangre en el vmito o [employer], debe acudir a la sala de Multimedia programmer.     ED Prescriptions     Medication Sig Dispense Auth. Provider   sucralfate (CARAFATE) 1 g tablet Take 1 tablet (1 g total) by mouth 4 (four) times daily -  with meals and at bedtime. 28 tablet Dorian Duval K, PA-C   pantoprazole (PROTONIX) 40 MG tablet Take 1 tablet (40 mg total) by mouth daily. 14 tablet Cataleyah Colborn, Derry Skill, PA-C      PDMP not reviewed this encounter.   Terrilee Croak, PA-C 07/24/22 1413

## 2022-07-24 NOTE — ED Triage Notes (Signed)
Via [employer] video interpreter [phone]: Pt describes intermittent "burning or hurting" epigastric pain radiating through to her back over past few days; states has tried Tyl without relief, and occasionally has relief with Advil. Denies n/v/d.

## 2022-07-30 ENCOUNTER — Ambulatory Visit: Payer: Self-pay | Admitting: Gastroenterology

## 2022-08-07 ENCOUNTER — Emergency Department (HOSPITAL_COMMUNITY): Payer: Self-pay

## 2022-08-07 ENCOUNTER — Encounter (HOSPITAL_COMMUNITY): Payer: Self-pay | Admitting: Emergency Medicine

## 2022-08-07 ENCOUNTER — Other Ambulatory Visit: Payer: Self-pay

## 2022-08-07 ENCOUNTER — Emergency Department (HOSPITAL_COMMUNITY)
Admission: EM | Admit: 2022-08-07 | Discharge: 2022-08-08 | Disposition: A | Discharge status: Home / Self Care | Payer: Self-pay | Attending: Emergency Medicine | Admitting: Emergency Medicine

## 2022-08-07 DIAGNOSIS — K8020 Calculus of gallbladder without cholecystitis without obstruction: Secondary | ICD-10-CM | POA: Insufficient documentation

## 2022-08-07 DIAGNOSIS — R1013 Epigastric pain: Secondary | ICD-10-CM

## 2022-08-07 LAB — CBC WITH DIFFERENTIAL/PLATELET
Abs Immature Granulocytes: 0.01 10*3/uL (ref 0.00–0.07)
Basophils Absolute: 0 10*3/uL (ref 0.0–0.1)
Basophils Relative: 0 %
Eosinophils Absolute: 0 10*3/uL (ref 0.0–0.5)
Eosinophils Relative: 0 %
HCT: 41.5 % (ref 36.0–46.0)
Hemoglobin: 13 g/dL (ref 12.0–15.0)
Immature Granulocytes: 0 %
Lymphocytes Relative: 28 %
Lymphs Abs: 2.3 10*3/uL (ref 0.7–4.0)
MCH: 27 pg (ref 26.0–34.0)
MCHC: 31.3 g/dL (ref 30.0–36.0)
MCV: 86.1 fL (ref 80.0–100.0)
Monocytes Absolute: 0.4 10*3/uL (ref 0.1–1.0)
Monocytes Relative: 4 %
Neutro Abs: 5.3 10*3/uL (ref 1.7–7.7)
Neutrophils Relative %: 68 %
Platelets: 301 10*3/uL (ref 150–400)
RBC: 4.82 MIL/uL (ref 3.87–5.11)
RDW: 13.5 % (ref 11.5–15.5)
WBC: 8 10*3/uL (ref 4.0–10.5)
nRBC: 0 % (ref 0.0–0.2)

## 2022-08-07 LAB — LIPASE, BLOOD: Lipase: 28 U/L (ref 11–51)

## 2022-08-07 LAB — COMPREHENSIVE METABOLIC PANEL
ALT: 12 U/L (ref 0–44)
AST: 14 U/L — ABNORMAL LOW (ref 15–41)
Albumin: 3.7 g/dL (ref 3.5–5.0)
Alkaline Phosphatase: 70 U/L (ref 38–126)
Anion gap: 10 (ref 5–15)
BUN: 8 mg/dL (ref 6–20)
CO2: 21 mmol/L — ABNORMAL LOW (ref 22–32)
Calcium: 9.4 mg/dL (ref 8.9–10.3)
Chloride: 106 mmol/L (ref 98–111)
Creatinine, Ser: 0.71 mg/dL (ref 0.44–1.00)
GFR, Estimated: >60 mL/min (ref >60)
Glucose, Bld: 118 mg/dL — ABNORMAL HIGH (ref 70–99)
Potassium: 3.5 mmol/L (ref 3.5–5.1)
Sodium: 137 mmol/L (ref 135–145)
Total Bilirubin: 0.7 mg/dL (ref 0.3–1.2)
Total Protein: 7.8 g/dL (ref 6.5–8.1)

## 2022-08-07 NOTE — ED Triage Notes (Signed)
Pt c/o upper abdominal pain for the past 2 days. Endorses n/v. Seen for the same in January.

## 2022-08-07 NOTE — ED Provider Triage Note (Signed)
Emergency Medicine Provider Triage Evaluation Note  Christy Peters , a 47 y.o. female  was evaluated in triage.  Pt complains of upper abdominal pain for the last 2 days.  Previously evaluated urgent care and diagnosed with gastritis?,  Sent to the gastroenterologist but she has an appointment in April.  Reports the pain is now constant, exacerbated throughout the day.  Has taken Tylenol without any improvement in symptoms.  Pain seems to be more epigastric along with right upper quadrant.  Also endorsing some nausea.  Last ate cake this morning around 11 AM.  Review of Systems  Positive: Abdominal pain, nausea Negative: Fever, cough  Physical Exam  LMP 07/17/2022 (Approximate)  Gen:   Awake, no distress   Resp:  Normal effort  MSK:   Moves extremities without difficulty  Other:  RUQ ttp   Medical Decision Making  Medically screening exam initiated at 8:19 PM.  Appropriate orders placed.  Suzet Riedlinger was informed that the remainder of the evaluation will be completed by another provider, this initial triage assessment does not replace that evaluation, and the importance of remaining in the ED until their evaluation is complete.     Janeece Fitting, PA-C 08/07/22 2025

## 2022-08-08 LAB — URINALYSIS, ROUTINE W REFLEX MICROSCOPIC
Bilirubin Urine: NEGATIVE
Glucose, UA: NEGATIVE mg/dL
Hgb urine dipstick: NEGATIVE
Ketones, ur: 20 mg/dL — AB
Leukocytes,Ua: NEGATIVE
Nitrite: NEGATIVE
Protein, ur: NEGATIVE mg/dL
Specific Gravity, Urine: 1.017 (ref 1.005–1.030)
pH: 6 (ref 5.0–8.0)

## 2022-08-08 LAB — PREGNANCY, URINE: Preg Test, Ur: NEGATIVE

## 2022-08-08 MED ORDER — DICYCLOMINE HCL 10 MG PO CAPS
10.0000 mg | ORAL_CAPSULE | Freq: Once | ORAL | Status: AC
  Administered 2022-08-08: 10 mg via ORAL
  Filled 2022-08-08: qty 1

## 2022-08-08 MED ORDER — PANTOPRAZOLE SODIUM 40 MG PO TBEC
40.0000 mg | DELAYED_RELEASE_TABLET | Freq: Every day | ORAL | Status: DC
  Administered 2022-08-08: 40 mg via ORAL
  Filled 2022-08-08: qty 1

## 2022-08-08 MED ORDER — OXYCODONE-ACETAMINOPHEN 5-325 MG PO TABS: 1.0000 | ORAL_TABLET | Freq: Four times a day (QID) | ORAL | 0 refills | Status: AC | PRN

## 2022-08-08 MED ORDER — ALUM & MAG HYDROXIDE-SIMETH 200-200-20 MG/5ML PO SUSP
30.0000 mL | Freq: Once | ORAL | Status: AC
  Administered 2022-08-08: 30 mL via ORAL
  Filled 2022-08-08: qty 30

## 2022-08-08 MED ORDER — OXYCODONE-ACETAMINOPHEN 5-325 MG PO TABS
1.0000 | ORAL_TABLET | Freq: Once | ORAL | Status: AC
  Administered 2022-08-08: 1 via ORAL
  Filled 2022-08-08: qty 1

## 2022-08-08 MED ORDER — PANTOPRAZOLE SODIUM 20 MG PO TBEC: 40.0000 mg | DELAYED_RELEASE_TABLET | Freq: Every day | ORAL | 0 refills | Status: AC

## 2022-08-08 NOTE — ED Provider Notes (Signed)
[facility] Provider Note   CSN: [id number] Arrival date & time: 08/07/22  1927     History  Chief Complaint  Patient presents with   Abdominal Pain    Christy Peters is a 47 y.o. female.  HPI     47 year old female with no significant medical history presents with concern for epigastric abdominal pain.  She been seen in urgent care for similar concerns in February.  Reports that she was given medication (pantoprazole and sucralfate) which seemed to help her intermittent pain, no longer has any more of that medication and the pain has been worsening.  Yesterday, the pain started and has been constant ever since then.  It is a constant severe pain, last night she could not get comfortable.  Currently, the pain is a 6 out of 10.  Feels it in the middle of the epigastric region then points to movement to left upper quadrant.  Denies the pain worsening with eating.  It had been intermittent over the last several weeks, prior to being constant last night.  Denies fever, nausea, vomiting, constipation, diarrhea, dysuria, chest pain, shortness of breath.   No other medical history or surgical history with exception of prior C-sections. History reviewed. No pertinent past medical history.   Home Medications Prior to Admission medications   Medication Sig Start Date End Date Taking? Authorizing Provider  oxyCODONE-acetaminophen (PERCOCET/ROXICET) 5-325 MG tablet Take 1 tablet by mouth every 6 (six) hours as needed for severe pain. 08/08/22  Yes Gareth Morgan, MD  pantoprazole (PROTONIX) 20 MG tablet Take 2 tablets (40 mg total) by mouth daily. 08/08/22 09/07/22 Yes Gareth Morgan, MD  sucralfate (CARAFATE) 1 g tablet Take 1 tablet (1 g total) by mouth 4 (four) times daily -  with meals and at bedtime. 07/24/22   Raspet, Derry Skill, PA-C      Allergies    Patient has no known allergies.    Review of Systems   Review of Systems  Physical  Exam Updated Vital Signs BP (!) 151/101 (BP Location: Right Arm)   Pulse 73   Temp 98.3 F (36.8 C) (Oral)   Resp 18   LMP 07/17/2022 (Approximate)   SpO2 100%  Physical Exam Vitals and nursing note reviewed.  Constitutional:      General: She is not in acute distress.    Appearance: Normal appearance. She is not ill-appearing, toxic-appearing or diaphoretic.  HENT:     Head: Normocephalic.  Eyes:     Conjunctiva/sclera: Conjunctivae normal.  Cardiovascular:     Rate and Rhythm: Normal rate and regular rhythm.     Pulses: Normal pulses.  Pulmonary:     Effort: Pulmonary effort is normal. No respiratory distress.  Abdominal:     General: Abdomen is flat. There is no distension.     Tenderness: There is abdominal tenderness in the right upper quadrant, epigastric area and left upper quadrant. Negative signs include Murphy's sign and McBurney's sign.  Musculoskeletal:        General: No deformity or signs of injury.     Cervical back: No rigidity.  Skin:    General: Skin is warm and dry.     Coloration: Skin is not jaundiced or pale.  Neurological:     General: No focal deficit present.     Mental Status: She is alert and oriented to person, place, and time.     ED Results / Procedures / Treatments   Labs (all labs  ordered are listed, but only abnormal results are displayed) Labs Reviewed  COMPREHENSIVE METABOLIC PANEL - Abnormal; Notable for the following components:      Result Value   CO2 21 (*)    Glucose, Bld 118 (*)    AST 14 (*)    All other components within normal limits  URINALYSIS, ROUTINE W REFLEX MICROSCOPIC - Abnormal; Notable for the following components:   APPearance HAZY (*)    Ketones, ur 20 (*)    All other components within normal limits  CBC WITH DIFFERENTIAL/PLATELET  LIPASE, BLOOD  PREGNANCY, URINE    EKG None  Radiology US Abdomen Limited  Result Date: 08/07/2022 CLINICAL DATA:  RUQ pain EXAM: ULTRASOUND ABDOMEN LIMITED RIGHT UPPER  QUADRANT COMPARISON:  None Available. FINDINGS: Gallbladder: Calcified gallstone within the gallbladder lumen. Gallbladder sludge within the gallbladder lumen. Gallbladder wall thickening (4 mm). No definite pericholecystic fluid. Thickening visualized. No sonographic Murphy sign noted by sonographer. Common bile duct: Diameter: 3 mm. Liver: No focal lesion identified. Within normal limits in parenchymal echogenicity. Portal vein is patent on color Doppler imaging with normal direction of blood flow towards the liver. Other: None. IMPRESSION: Gallbladder sludge and calcified gallstone with associated gallbladder wall thickening. No pericholecystic fluid. Negative sonographic Murphy sign. Correlate clinically for acute cholecystitis. Electronically Signed   By: Iven Finn M.D.   On: 08/07/2022 21:19    Procedures Procedures    Medications Ordered in ED Medications  pantoprazole (PROTONIX) EC tablet 40 mg (40 mg Oral Given 08/08/22 1025)  alum & mag hydroxide-simeth (MAALOX/MYLANTA) 200-200-20 MG/5ML suspension 30 mL (30 mLs Oral Given 08/08/22 1026)  dicyclomine (BENTYL) capsule 10 mg (10 mg Oral Given 08/08/22 1025)  oxyCODONE-acetaminophen (PERCOCET/ROXICET) 5-325 MG per tablet 1 tablet (1 tablet Oral Given 08/08/22 1025)    ED Course/ Medical Decision Making/ A&P                              47 year old female with no significant medical history presents with concern for epigastric abdominal pain.    DDx includes pancreatitis, cholecystitis, pyelonephritis, nephrolithiasis, SBO, gastritis, PUD, cholelithiasis.  Low suspicion for ovarian torsion, TOA, PID, diverticulitis or appendicitis given no lower abdominal pain or tenderness.  She is not having chest pain or shortness of breath, does have epigastric tenderness ,and have low suspicion for ACS or PE.  Do not feel history and exam are consistent with nephrolithiasis nor SBO.  Labs were obtained and personally about interpreted by me show  urinalysis without signs of infection, CBC without anemia or leukocytosis, glucose 118, no clinically significant electrolyte abnormalities, normal transaminases and a normal lipase.  Pregnancy test negative.  Right upper quadrant ultrasound was ordered and evaluated by myself and radiology and shows gallbladder sludge and calcified gallstone.  There is gallbladder wall thickening, however no pericholecystic fluid, negative sonographic Murphy sign.  While she describes epigastric abdominal pain over weeks with more constant pain today, her overall history and exam is not consistent with cholecystitis.  She initially felt relief with gastritis treatment, pain located more centrally rather than right upper quadrant, and no significant right upper quadrant tenderness on exam.  The pain is not worse with eating, she has no leukocytosis, no fever, no transaminitis.  Her discussed her ultrasound and history with Dr. Grandville Silos of general surgery.  If she is improved, feel outpatient follow-up with surgery is appropriate.    Given GI cocktail, pantoprazole, 1 dose of oxycodone  in the emergency department with improvement of pain.  Discussed reasons to return to the emergency department.  Discussed possibility of symptomatic cholelithiasis, although history of more central epigastric pain with improvement with PPI prior and PUD/gastritis still possible.  Recommend outpatient follow up with GI and general surgery.  Will initiate PPI again and give rx for pain medication (after discussion of risks and review in [place] drug database) for possible symptomatic cholelithiasis.  Discussed reasons to return. Patient discharged in stable condition with understanding of reasons to return.     Appropriate:1}      Final Clinical Impression(s) / ED Diagnoses Final diagnoses:  Epigastric pain  Calculus of gallbladder without cholecystitis without obstruction    Rx / DC Orders ED Discharge Orders          Ordered     oxyCODONE-acetaminophen (PERCOCET/ROXICET) 5-325 MG tablet  Every 6 hours PRN        08/08/22 1056    pantoprazole (PROTONIX) 20 MG tablet  Daily        08/08/22 1056              Gareth Morgan, MD 08/08/22 1141

## 2022-09-17 ENCOUNTER — Ambulatory Visit: Payer: Self-pay | Admitting: Gastroenterology

## 2022-12-11 ENCOUNTER — Ambulatory Visit: Payer: Self-pay | Admitting: Gastroenterology

## 2022-12-11 NOTE — Progress Notes (Deleted)
HPI :    No past medical history on file.   Past Surgical History:  Procedure Laterality Date   CESAREAN SECTION     No family history on file. Social History   Tobacco Use   Smoking status: Never   Smokeless tobacco: Never  Vaping Use   Vaping status: Never Used  Substance Use Topics   Alcohol use: Yes    Comment: occasionally   Current Outpatient Medications  Medication Sig Dispense Refill   oxyCODONE-acetaminophen (PERCOCET/ROXICET) 5-325 MG tablet Take 1 tablet by mouth every 6 (six) hours as needed for severe pain. 12 tablet 0   pantoprazole (PROTONIX) 20 MG tablet Take 2 tablets (40 mg total) by mouth daily. 60 tablet 0   phentermine (ADIPEX-P) 37.5 MG tablet Take 37.5 mg by mouth daily.     sucralfate (CARAFATE) 1 g tablet Take 1 tablet (1 g total) by mouth 4 (four) times daily -  with meals and at bedtime. 28 tablet 0   No current facility-administered medications for this visit.   No Known Allergies   Review of Systems: All systems reviewed and negative except where noted in HPI.    No results found.  Physical Exam: There were no vitals taken for this visit. Constitutional: Pleasant,well-developed, ***female in no acute distress. HEENT: Normocephalic and atraumatic. Conjunctivae are normal. No scleral icterus. Neck supple.  Cardiovascular: Normal rate, regular rhythm.  Pulmonary/chest: Effort normal and breath sounds normal. No wheezing, rales or rhonchi. Abdominal: Soft, nondistended, nontender. Bowel sounds active throughout. There are no masses palpable. No hepatomegaly. Extremities: no edema Lymphadenopathy: No cervical adenopathy noted. Neurological: Alert and oriented to person place and time. Skin: Skin is warm and dry. No rashes noted. Psychiatric: Normal mood and affect. Behavior is normal.  CBC    Component Value Date/Time   WBC 8.0 08/07/2022 2038   RBC 4.82 08/07/2022 2038   HGB 13.0 08/07/2022 2038   HCT 41.5 08/07/2022 2038   PLT  301 08/07/2022 2038   MCV 86.1 08/07/2022 2038   MCH 27.0 08/07/2022 2038   MCHC 31.3 08/07/2022 2038   RDW 13.5 08/07/2022 2038   LYMPHSABS 2.3 08/07/2022 2038   MONOABS 0.4 08/07/2022 2038   EOSABS 0.0 08/07/2022 2038   BASOSABS 0.0 08/07/2022 2038    CMP     Component Value Date/Time   NA 137 08/07/2022 2038   K 3.5 08/07/2022 2038   CL 106 08/07/2022 2038   CO2 21 (L) 08/07/2022 2038   GLUCOSE 118 (H) 08/07/2022 2038   BUN 8 08/07/2022 2038   CREATININE 0.71 08/07/2022 2038   CALCIUM 9.4 08/07/2022 2038   PROT 7.8 08/07/2022 2038   ALBUMIN 3.7 08/07/2022 2038   AST 14 (L) 08/07/2022 2038   ALT 12 08/07/2022 2038   ALKPHOS 70 08/07/2022 2038   BILITOT 0.7 08/07/2022 2038   GFRNONAA >60 08/07/2022 2038       Latest Ref Rng & Units 08/07/2022    8:38 PM 07/24/2022    2:02 PM 11/14/2006    5:30 AM  CBC EXTENDED  WBC 4.0 - 10.5 K/uL 8.0  6.4  8.9   RBC 3.87 - 5.11 MIL/uL 4.82  4.62  3.82   Hemoglobin 12.0 - 15.0 g/dL 40.1  02.7  25.3 DELTA CHECK NOTED   HCT 36.0 - 46.0 % 41.5  39.8  32.4   Platelets 150 - 400 K/uL 301  275  130   NEUT# 1.7 - 7.7 K/uL 5.3  3.0  Lymph# 0.7 - 4.0 K/uL 2.3  2.9        ASSESSMENT AND PLAN:  No ref. provider found
